# Patient Record
Sex: Female | Born: 2000 | Race: Black or African American | Hispanic: No | Marital: Single | Smoking: Never smoker
Health system: Southern US, Community
[De-identification: ages and names within clinical notes are randomized; demographics above are authoritative.]

---

## 2018-09-03 ENCOUNTER — Other Ambulatory Visit: Payer: Self-pay

## 2018-09-03 ENCOUNTER — Emergency Department (HOSPITAL_COMMUNITY)
Admission: EM | Admit: 2018-09-03 | Discharge: 2018-09-03 | Disposition: A | Discharge status: Home / Self Care | Payer: 59 | Attending: Emergency Medicine | Admitting: Emergency Medicine

## 2018-09-03 ENCOUNTER — Encounter (HOSPITAL_COMMUNITY): Payer: Self-pay | Admitting: Emergency Medicine

## 2018-09-03 DIAGNOSIS — R112 Nausea with vomiting, unspecified: Secondary | ICD-10-CM | POA: Insufficient documentation

## 2018-09-03 LAB — COMPREHENSIVE METABOLIC PANEL
ALBUMIN: 4.5 g/dL (ref 3.5–5.0)
ALT: 17 U/L (ref 0–44)
AST: 22 U/L (ref 15–41)
Alkaline Phosphatase: 60 U/L (ref 38–126)
Anion gap: 10 (ref 5–15)
BUN: 9 mg/dL (ref 6–20)
CO2: 24 mmol/L (ref 22–32)
Calcium: 9.5 mg/dL (ref 8.9–10.3)
Chloride: 104 mmol/L (ref 98–111)
Creatinine, Ser: 0.83 mg/dL (ref 0.44–1.00)
GFR calc Af Amer: >60 mL/min (ref >60)
GFR calc non Af Amer: >60 mL/min (ref >60)
GLUCOSE: 89 mg/dL (ref 70–99)
Potassium: 3.7 mmol/L (ref 3.5–5.1)
SODIUM: 138 mmol/L (ref 135–145)
Total Bilirubin: 0.7 mg/dL (ref 0.3–1.2)
Total Protein: 8.3 g/dL — ABNORMAL HIGH (ref 6.5–8.1)

## 2018-09-03 LAB — URINALYSIS, ROUTINE W REFLEX MICROSCOPIC
Bilirubin Urine: NEGATIVE
Glucose, UA: NEGATIVE mg/dL
Hgb urine dipstick: NEGATIVE
Ketones, ur: 5 mg/dL — AB
Nitrite: NEGATIVE
Protein, ur: NEGATIVE mg/dL
Specific Gravity, Urine: 1.016 (ref 1.005–1.030)
pH: 6 (ref 5.0–8.0)

## 2018-09-03 LAB — I-STAT BETA HCG BLOOD, ED (MC, WL, AP ONLY): I-stat hCG, quantitative: <5 m[IU]/mL (ref <5)

## 2018-09-03 LAB — CBC
HCT: 38.9 % (ref 36.0–46.0)
Hemoglobin: 12.4 g/dL (ref 12.0–15.0)
MCH: 29.2 pg (ref 26.0–34.0)
MCHC: 31.9 g/dL (ref 30.0–36.0)
MCV: 91.5 fL (ref 80.0–100.0)
PLATELETS: 235 10*3/uL (ref 150–400)
RBC: 4.25 MIL/uL (ref 3.87–5.11)
RDW: 12.9 % (ref 11.5–15.5)
WBC: 10.3 10*3/uL (ref 4.0–10.5)
nRBC: 0 % (ref 0.0–0.2)

## 2018-09-03 LAB — LIPASE, BLOOD: Lipase: 31 U/L (ref 11–51)

## 2018-09-03 MED ORDER — SODIUM CHLORIDE 0.9% FLUSH: 3.0000 mL | Freq: Once | INTRAVENOUS | Status: DC

## 2018-09-03 MED ORDER — ONDANSETRON 4 MG PO TBDP
4.0000 mg | ORAL_TABLET | Freq: Once | ORAL | Status: AC
  Administered 2018-09-03: 4 mg via ORAL
  Filled 2018-09-03: qty 1

## 2018-09-03 MED ORDER — ONDANSETRON 4 MG PO TBDP: 4.0000 mg | ORAL_TABLET | Freq: Three times a day (TID) | ORAL | 0 refills | Status: DC | PRN

## 2018-09-03 NOTE — ED Triage Notes (Signed)
Per EMS, patient from home, c/o N/V since 1500 after drinking smoothie. Ambulatory.

## 2018-09-03 NOTE — Discharge Instructions (Signed)
Follow up with your primary care doctor to discuss your hospital visit. Continue to hydrate orally with small sips of fluids throughout the day. Use Zofran as directed for nausea & vomiting.  ° °The 'BRAT' diet is suggested, then progress to diet as tolerated as symptoms abate.  °Bananas.  °Rice.  °Applesauce.  °Toast (and other simple starches such as crackers, potatoes, noodles).  ° °SEEK IMMEDIATE MEDICAL ATTENTION IF: °You begin having localized abdominal pain that does not go away or becomes severe °You develop a fever °Repeated vomiting occurs (multiple uncontrollable episodes) or you are unable to keep fluids down °Blood is being passed in stools or vomit (bright red or black tarry stools).  °New or worsening symptoms develop, any additional concerns.  °

## 2018-09-03 NOTE — ED Provider Notes (Signed)
[facility]-EMERGENCY DEPT Provider Note   CSN: [id number] Arrival date & time: 09/03/18  1814    History   Chief Complaint Chief Complaint  Patient presents with   Emesis    HPI Kaitlyn Deleon is a 18 y.o. female.     The history is provided by the patient and medical records. No language interpreter was used.  Emesis  Associated symptoms: abdominal pain   Associated symptoms: no diarrhea    Kaitlyn Deleon is a 18 y.o. female with no known past medical history who presents to the Emergency Department complaining of nausea and vomiting that began about 3:00 this afternoon.  Patient states that she drank a smoothie and within a few minutes after this, she threw up.  She did have generalized abdominal pain at onset which has been improving.  She has not been able to keep any fluids down since the onset of her symptoms.  She denies any known sick contacts.  No travel.  No fever.  No diarrhea, constipation or blood in the stool.  No chest pain or trouble breathing.  History reviewed. No pertinent past medical history.  There are no active problems to display for this patient.   History reviewed. No pertinent surgical history.   OB History   No obstetric history on file.      Home Medications    Prior to Admission medications   Medication Sig Start Date End Date Taking? Authorizing Provider  ondansetron (ZOFRAN ODT) 4 MG disintegrating tablet Take 1 tablet (4 mg total) by mouth every 8 (eight) hours as needed for nausea or vomiting. 09/03/18   Gearldene Fiorenza, Chase Picket, PA-C    Family History No family history on file.  Social History Social History   Tobacco Use   Smoking status: Not on file  Substance Use Topics   Alcohol use: Not on file   Drug use: Not on file     Allergies   Patient has no known allergies.   Review of Systems Review of Systems  Gastrointestinal: Positive for abdominal pain, nausea and vomiting. Negative for blood in  stool, constipation and diarrhea.  All other systems reviewed and are negative.    Physical Exam Updated Vital Signs BP 109/64    Pulse 62    Temp 98.6 F (37 C) (Oral)    Resp 16    LMP 08/05/2018 (Approximate)    SpO2 100%   Physical Exam Vitals signs and nursing note reviewed.  Constitutional:      General: She is not in acute distress.    Appearance: She is well-developed.     Comments: Well-appearing.  HENT:     Head: Normocephalic and atraumatic.  Neck:     Musculoskeletal: Neck supple.  Cardiovascular:     Heart sounds: Normal heart sounds. No murmur.     Comments: Regular rate and rhythm on exam. Pulmonary:     Effort: Pulmonary effort is normal. No respiratory distress.     Breath sounds: Normal breath sounds.  Abdominal:     General: There is no distension.     Palpations: Abdomen is soft.     Comments: Mild generalized abdominal tenderness without rebound or guarding.  No focal tenderness at McBurney's.  Negative Murphy's.  Skin:    General: Skin is warm and dry.  Neurological:     Mental Status: She is alert and oriented to person, place, and time.      ED Treatments / Results  Labs (all labs ordered  are listed, but only abnormal results are displayed) Labs Reviewed  COMPREHENSIVE METABOLIC PANEL - Abnormal; Notable for the following components:      Result Value   Total Protein 8.3 (*)    All other components within normal limits  URINALYSIS, ROUTINE W REFLEX MICROSCOPIC - Abnormal; Notable for the following components:   APPearance HAZY (*)    Ketones, ur 5 (*)    Leukocytes,Ua SMALL (*)    Bacteria, UA RARE (*)    All other components within normal limits  LIPASE, BLOOD  CBC  I-STAT BETA HCG BLOOD, ED ([facility], [facility], [facility] ONLY)    EKG None  Radiology No results found.  Procedures Procedures (including critical care time)  Medications Ordered in ED Medications  sodium chloride flush (NS) 0.9 % injection 3 mL (has no administration in time  range)  ondansetron (ZOFRAN-ODT) disintegrating tablet 4 mg (4 mg Oral Given 09/03/18 1956)     Initial Impression / Assessment and Plan / ED Course  I have reviewed the triage vital signs and the nursing notes.  Pertinent labs & imaging results that were available during my care of the patient were reviewed by me and considered in my medical decision making (see chart for details).       Gianna Deleon is a 18 y.o. female who presents to ED for nausea, vomiting which began just prior to arrival. On exam, patient is afebrile, non-toxic appearing with a non-surgical abdominal exam.  She states that she threw up after having a smoothie.  She then had 1 more episode of emesis.  Her abdominal pain has been gradually improving since the incident.  She actually feels much better than symptom onset.  She had 4 mg of ODT Zofran.  On reevaluation, she reports that she now feels fine.  Repeat abdominal exam completely benign.  She is tolerating PO without any difficulty. Evaluation does not show pathology that would require ongoing emergent intervention or inpatient treatment. Rx for Zofran given. PCP follow up encouraged. Spoke at length with patient about signs or symptoms that should prompt return to emergency Department including inability to tolerate PO, blood in the stools, fevers, focal localization of abdominal pain, new/worsening symptoms or any additional concerns. Patient understands diagnosis and plan of care as dictated above. All questions answered.   Final Clinical Impressions(s) / ED Diagnoses   Final diagnoses:  Non-intractable vomiting with nausea, unspecified vomiting type    ED Discharge Orders         Ordered    ondansetron (ZOFRAN ODT) 4 MG disintegrating tablet  Every 8 hours PRN     09/03/18 2038           Ronica Vivian, Chase Picket, PA-C 09/03/18 2155    Melene Plan, DO 09/03/18 2218

## 2018-10-31 ENCOUNTER — Encounter (HOSPITAL_COMMUNITY): Payer: Self-pay | Admitting: Emergency Medicine

## 2018-10-31 ENCOUNTER — Emergency Department (HOSPITAL_COMMUNITY)
Admission: EM | Admit: 2018-10-31 | Discharge: 2018-10-31 | Disposition: A | Discharge status: Home / Self Care | Payer: 59 | Attending: Emergency Medicine | Admitting: Emergency Medicine

## 2018-10-31 ENCOUNTER — Other Ambulatory Visit: Payer: Self-pay

## 2018-10-31 DIAGNOSIS — E876 Hypokalemia: Secondary | ICD-10-CM

## 2018-10-31 DIAGNOSIS — O219 Vomiting of pregnancy, unspecified: Secondary | ICD-10-CM | POA: Diagnosis not present

## 2018-10-31 DIAGNOSIS — Z3A01 Less than 8 weeks gestation of pregnancy: Secondary | ICD-10-CM | POA: Insufficient documentation

## 2018-10-31 DIAGNOSIS — O99281 Endocrine, nutritional and metabolic diseases complicating pregnancy, first trimester: Secondary | ICD-10-CM | POA: Insufficient documentation

## 2018-10-31 LAB — URINALYSIS, ROUTINE W REFLEX MICROSCOPIC
Bilirubin Urine: NEGATIVE
Glucose, UA: NEGATIVE mg/dL
Hgb urine dipstick: NEGATIVE
Ketones, ur: 80 mg/dL — AB
Nitrite: NEGATIVE
Protein, ur: 100 mg/dL — AB
Specific Gravity, Urine: 1.031 — ABNORMAL HIGH (ref 1.005–1.030)
pH: 6 (ref 5.0–8.0)

## 2018-10-31 LAB — BASIC METABOLIC PANEL
Anion gap: 18 — ABNORMAL HIGH (ref 5–15)
BUN: 11 mg/dL (ref 6–20)
CO2: 18 mmol/L — ABNORMAL LOW (ref 22–32)
Calcium: 9.8 mg/dL (ref 8.9–10.3)
Chloride: 99 mmol/L (ref 98–111)
Creatinine, Ser: 0.84 mg/dL (ref 0.44–1.00)
GFR calc Af Amer: >60 mL/min (ref >60)
GFR calc non Af Amer: >60 mL/min (ref >60)
Glucose, Bld: 94 mg/dL (ref 70–99)
Potassium: 2.9 mmol/L — ABNORMAL LOW (ref 3.5–5.1)
Sodium: 135 mmol/L (ref 135–145)

## 2018-10-31 LAB — CBC WITH DIFFERENTIAL/PLATELET
Abs Immature Granulocytes: 0.06 10*3/uL (ref 0.00–0.07)
Basophils Absolute: 0.1 10*3/uL (ref 0.0–0.1)
Basophils Relative: 0 %
Eosinophils Absolute: 0 10*3/uL (ref 0.0–0.5)
Eosinophils Relative: 0 %
HCT: 40.9 % (ref 36.0–46.0)
Hemoglobin: 13.8 g/dL (ref 12.0–15.0)
Immature Granulocytes: 1 %
Lymphocytes Relative: 10 %
Lymphs Abs: 1.3 10*3/uL (ref 0.7–4.0)
MCH: 28.9 pg (ref 26.0–34.0)
MCHC: 33.7 g/dL (ref 30.0–36.0)
MCV: 85.6 fL (ref 80.0–100.0)
Monocytes Absolute: 0.5 10*3/uL (ref 0.1–1.0)
Monocytes Relative: 4 %
Neutro Abs: 11.3 10*3/uL — ABNORMAL HIGH (ref 1.7–7.7)
Neutrophils Relative %: 85 %
Platelets: 292 10*3/uL (ref 150–400)
RBC: 4.78 MIL/uL (ref 3.87–5.11)
RDW: 12.2 % (ref 11.5–15.5)
WBC: 13.2 10*3/uL — ABNORMAL HIGH (ref 4.0–10.5)
nRBC: 0 % (ref 0.0–0.2)

## 2018-10-31 LAB — I-STAT BETA HCG BLOOD, ED (MC, WL, AP ONLY): I-stat hCG, quantitative: >2000 m[IU]/mL — ABNORMAL HIGH (ref <5)

## 2018-10-31 MED ORDER — SODIUM CHLORIDE 0.9 % IV BOLUS
1000.0000 mL | Freq: Once | INTRAVENOUS | Status: AC
  Administered 2018-10-31: 1000 mL via INTRAVENOUS

## 2018-10-31 MED ORDER — METOCLOPRAMIDE HCL 5 MG/ML IJ SOLN
10.0000 mg | Freq: Once | INTRAMUSCULAR | Status: AC
  Administered 2018-10-31: 01:00:00 10 mg via INTRAVENOUS
  Filled 2018-10-31: qty 2

## 2018-10-31 MED ORDER — POTASSIUM CHLORIDE CRYS ER 20 MEQ PO TBCR
40.0000 meq | EXTENDED_RELEASE_TABLET | Freq: Once | ORAL | Status: AC
  Administered 2018-10-31: 40 meq via ORAL
  Filled 2018-10-31: qty 2

## 2018-10-31 MED ORDER — METOCLOPRAMIDE HCL 10 MG PO TABS: 10.0000 mg | ORAL_TABLET | Freq: Four times a day (QID) | ORAL | 0 refills | Status: AC

## 2018-10-31 NOTE — ED Triage Notes (Signed)
Pt states she is 7 weeks preg and having issues with N/V and is unable to keep food or drink down for the last  Week. Pt report having generalized weakness over last several days.

## 2018-10-31 NOTE — ED Provider Notes (Signed)
[facility]-EMERGENCY DEPT Provider Note   CSN: [id number] Arrival date & time: 10/31/18  0014    History   Chief Complaint No chief complaint on file.   HPI Kaitlyn Deleon is a 18 y.o. female.     Patient with no previous abdominal surgeries, G1P0 who is currently approximately [redacted] weeks pregnant presents with complaint of nausea and vomiting over the past week.  Patient states that she has not been able to keep down solid foods and only some liquids.  She denies any abdominal pain.  No vaginal bleeding.  She reports a mild white discharge.  No urinary symptoms.  She has been trying to eat bland foods, fruit, ginger ale at home without any improvement.  No medications.  She states that she had a recent ultrasound due to vomiting at the [facility] clinic.  She states that this is how she knows she is about [redacted] weeks pregnant.     History reviewed. No pertinent past medical history.  There are no active problems to display for this patient.   History reviewed. No pertinent surgical history.   OB History   No obstetric history on file.      Home Medications    Prior to Admission medications   Medication Sig Start Date End Date Taking? Authorizing Provider  ondansetron (ZOFRAN ODT) 4 MG disintegrating tablet Take 1 tablet (4 mg total) by mouth every 8 (eight) hours as needed for nausea or vomiting. 09/03/18   Ward, Chase Picket, PA-C    Family History History reviewed. No pertinent family history.  Social History Social History   Tobacco Use  . Smoking status: Never Smoker  . Smokeless tobacco: Never Used  Substance Use Topics  . Alcohol use: Yes  . Drug use: Never     Allergies   Patient has no known allergies.   Review of Systems Review of Systems  Constitutional: Negative for fever.  HENT: Negative for rhinorrhea and sore throat.   Eyes: Negative for redness.  Respiratory: Negative for cough and shortness of breath.    Cardiovascular: Negative for chest pain.  Gastrointestinal: Positive for nausea and vomiting. Negative for abdominal pain and diarrhea.  Genitourinary: Positive for vaginal discharge. Negative for dysuria and vaginal bleeding.  Musculoskeletal: Negative for myalgias.  Skin: Negative for rash.  Neurological: Negative for headaches.     Physical Exam Updated Vital Signs BP (!) 105/94 (BP Location: Left Arm)   Pulse (!) 102   Temp 98.5 F (36.9 C) (Oral)   Resp 18   Ht 5\' 7"  (1.702 m)   Wt 63.5 kg   SpO2 100%   BMI 21.93 kg/m   Physical Exam Vitals signs and nursing note reviewed.  Constitutional:      Appearance: She is well-developed.  HENT:     Head: Normocephalic and atraumatic.  Eyes:     Conjunctiva/sclera: Conjunctivae normal.  Neck:     Musculoskeletal: Normal range of motion and neck supple.  Pulmonary:     Effort: No respiratory distress.  Abdominal:     Tenderness: There is no abdominal tenderness. There is no guarding or rebound.  Skin:    General: Skin is warm and dry.  Neurological:     Mental Status: She is alert.      ED Treatments / Results  Labs (all labs ordered are listed, but only abnormal results are displayed) Labs Reviewed  CBC WITH DIFFERENTIAL/PLATELET  BASIC METABOLIC PANEL  URINALYSIS, ROUTINE W REFLEX MICROSCOPIC  I-STAT  BETA HCG BLOOD, ED ([facility], [facility], [facility] ONLY)    EKG None  Radiology No results found.  Procedures Procedures (including critical care time)  Medications Ordered in ED Medications  sodium chloride 0.9 % bolus 1,000 mL (has no administration in time range)     Initial Impression / Assessment and Plan / ED Course  I have reviewed the triage vital signs and the nursing notes.  Pertinent labs & imaging results that were available during my care of the patient were reviewed by me and considered in my medical decision making (see chart for details).        Patient seen and examined. Work-up initiated.  Medications ordered. She looks well.   Vital signs reviewed and are as follows: BP (!) 105/94 (BP Location: Left Arm)   Pulse (!) 102   Temp 98.5 F (36.9 C) (Oral)   Resp 18   Ht 5\' 7"  (1.702 m)   Wt 63.5 kg   SpO2 100%   BMI 21.93 kg/m   3:52 AM patient has responded well to fluids and antiemetics while in the emergency department tonight.  She is now tolerating oral fluids and eating applesauce in the room.  Lab work significant for hypokalemia.  She has tolerated oral repletion.  Patient has not yet started taking prenatal vitamins and I encouraged her to do so.  Discharged home with Reglan for vomiting.  We also discussed use of doxylamine and vitamin B6.  Patient given instructions on how to use these.  Discussed clear liquid diet and bland foods over the next 24 hours.  Encouraged return if worsening or changing.  Otherwise, prenatal follow-up as planned.  Final Clinical Impressions(s) / ED Diagnoses   Final diagnoses:  Excessive vomiting in pregnancy  Hypokalemia   Patient with vomiting in first trimester pregnancy.  She has some mild dehydration tonight.  She has responded well to treatment in the emergency department.  Abdomen is soft and nontender.  She does not have any lower abdominal pain, cramping, back pain, vaginal bleeding to suggest threatened pregnancy.  She reportedly had a recent ultrasound to confirm intrauterine pregnancy.  Symptoms are controlled in the emergency department and patient is ready for discharge.  ED Discharge Orders         Ordered    metoCLOPramide (REGLAN) 10 MG tablet  Every 6 hours     10/31/18 0350           Renne CriglerGeiple, Paarth Cropper, PA-C 10/31/18 0354    Geoffery Lyonselo, Douglas, MD 10/31/18 [phone]

## 2018-10-31 NOTE — Discharge Instructions (Signed)
Please read and follow all provided instructions.  Your diagnoses today include:  1. Excessive vomiting in pregnancy   2. Hypokalemia     Tests performed today include:  Blood counts and electrolytes - shows low potassium  Blood tests to check kidney function  Urine test to look for infection - shows dehydration, no infection  Vital signs. See below for your results today.   Medications prescribed:   Reglan - medication for nausea and vomiting  You may use doxylamine, available as an over-the-counter sleeping pills (eg, Unisom Sleep Tabs): One-half of the 25 mg over-the-counter tablet twice a day for vomiting. In addition, pyridoxine 25 mg (Vitamin B6), also available over-the-counter, is taken three or four times per day for vomiting.   Take any prescribed medications only as directed.  Home care instructions:   Follow any educational materials contained in this packet.   Keep drinking plenty of fluids and use the medicine for nausea as directed.    Drink clear liquids for the next 24 hours and introduce solid foods slowly after 24 hours using the b.r.a.t. diet (Bananas, Rice, Applesauce, Toast, Yogurt).    Follow-up instructions: Please follow-up with your primary care provider in the next 2 days for further evaluation of your symptoms.   Return instructions:  SEEK IMMEDIATE MEDICAL ATTENTION IF:  If you have pain that does not go away or becomes severe   A temperature above 101F develops   Repeated vomiting occurs (multiple episodes)   If you have pain that becomes localized to portions of the abdomen. The right side could possibly be appendicitis. In an adult, the left lower portion of the abdomen could be colitis or diverticulitis.   Blood is being passed in stools or vomit (bright red or black tarry stools)   You develop chest pain, difficulty breathing, dizziness or fainting, or become confused, poorly responsive, or inconsolable (young children)  If you have  any other emergent concerns regarding your health  Additional Information: Abdominal (belly) pain can be caused by many things. Your caregiver performed an examination and possibly ordered blood/urine tests and imaging (CT scan, x-rays, ultrasound). Many cases can be observed and treated at home after initial evaluation in the emergency department. Even though you are being discharged home, abdominal pain can be unpredictable. Therefore, you need a repeated exam if your pain does not resolve, returns, or worsens. Most patients with abdominal pain don't have to be admitted to the hospital or have surgery, but serious problems like appendicitis and gallbladder attacks can start out as nonspecific pain. Many abdominal conditions cannot be diagnosed in one visit, so follow-up evaluations are very important.  Your vital signs today were: BP 101/79    Pulse 95    Temp 98.5 F (36.9 C) (Oral)    Resp 18    Ht 5\' 7"  (1.702 m)    Wt 63.5 kg    SpO2 100%    BMI 21.93 kg/m  If your blood pressure (bp) was elevated above 135/85 this visit, please have this repeated by your doctor within one month. --------------

## 2018-10-31 NOTE — ED Notes (Signed)
Pt given water as well as Sprite and apple sauce for PO challenge, which she tolerated well

## 2018-10-31 NOTE — ED Notes (Signed)
Pt wants more apple sauce.

## 2019-11-13 ENCOUNTER — Encounter (HOSPITAL_COMMUNITY): Payer: Self-pay

## 2019-11-13 ENCOUNTER — Other Ambulatory Visit: Payer: Self-pay

## 2019-11-13 ENCOUNTER — Ambulatory Visit (HOSPITAL_COMMUNITY)
Admission: EM | Admit: 2019-11-13 | Discharge: 2019-11-13 | Disposition: A | Discharge status: Home / Self Care | Payer: 59 | Attending: Family Medicine | Admitting: Family Medicine

## 2019-11-13 DIAGNOSIS — L723 Sebaceous cyst: Secondary | ICD-10-CM | POA: Diagnosis not present

## 2019-11-13 DIAGNOSIS — L089 Local infection of the skin and subcutaneous tissue, unspecified: Secondary | ICD-10-CM | POA: Diagnosis not present

## 2019-11-13 MED ORDER — HYDROCODONE-ACETAMINOPHEN 5-325 MG PO TABS: 1.0000 | ORAL_TABLET | Freq: Four times a day (QID) | ORAL | 0 refills | Status: AC | PRN

## 2019-11-13 MED ORDER — DOXYCYCLINE HYCLATE 100 MG PO CAPS: 100.0000 mg | ORAL_CAPSULE | Freq: Two times a day (BID) | ORAL | 0 refills | Status: AC

## 2019-11-13 NOTE — ED Provider Notes (Signed)
[facility]    CSN: [id number] Arrival date & time: 11/13/19  2202      History   Chief Complaint Chief Complaint  Patient presents with  . Cyst    HPI Kaitlyn Deleon is a 19 y.o. female.   HPI  Patient has a sebaceous cyst on her right anterior chest, breast area for about a month Because it is near her breast she went to her primary care doctor and got a referral for an ultrasound of her breast. It was confirmed to be a sebaceous cyst She states that since her ultrasound she has had increased pain, swelling, and redness of the cyst She is here to have it treated  History reviewed. No pertinent past medical history.  There are no problems to display for this patient.   History reviewed. No pertinent surgical history.  OB History   No obstetric history on file.      Home Medications    Prior to Admission medications   Medication Sig Start Date End Date Taking? Authorizing Provider  doxycycline (VIBRAMYCIN) 100 MG capsule Take 1 capsule (100 mg total) by mouth 2 (two) times daily. 11/13/19   Raylene Everts, MD  HYDROcodone-acetaminophen (NORCO/VICODIN) 5-325 MG tablet Take 1-2 tablets by mouth every 6 (six) hours as needed. 11/13/19   Raylene Everts, MD  metoCLOPramide (REGLAN) 10 MG tablet Take 1 tablet (10 mg total) by mouth every 6 (six) hours. 10/31/18   Carlisle Cater, PA-C  ondansetron (ZOFRAN ODT) 4 MG disintegrating tablet Take 1 tablet (4 mg total) by mouth every 8 (eight) hours as needed for nausea or vomiting. 09/03/18   Ward, Ozella Almond, PA-C    Family History History reviewed. No pertinent family history.  Social History Social History   Tobacco Use  . Smoking status: Never Smoker  . Smokeless tobacco: Never Used  Substance Use Topics  . Alcohol use: Yes  . Drug use: Never     Allergies   Patient has no known allergies.   Review of Systems Review of Systems  Skin: Positive for wound.     Physical Exam Triage  Vital Signs ED Triage Vitals  Enc Vitals Group     BP 11/13/19 1900 (!) 101/56     Pulse Rate 11/13/19 1900 93     Resp 11/13/19 1900 17     Temp 11/13/19 1900 98.5 F (36.9 C)     Temp src --      SpO2 11/13/19 1900 100 %     Weight --      Height --      Head Circumference --      Peak Flow --      Pain Score 11/13/19 1858 10     Pain Loc --      Pain Edu? --      Excl. in [place]? --    No data found.  Updated Vital Signs BP (!) 101/56 (BP Location: Right Arm)   Pulse 93   Temp 98.5 F (36.9 C)   Resp 17   LMP 10/26/2019 (Exact Date)   SpO2 100%       Physical Exam Constitutional:      General: She is not in acute distress.    Appearance: She is well-developed.  HENT:     Head: Normocephalic and atraumatic.  Eyes:     Conjunctiva/sclera: Conjunctivae normal.     Pupils: Pupils are equal, round, and reactive to light.  Cardiovascular:  Rate and Rhythm: Normal rate.  Pulmonary:     Effort: Pulmonary effort is normal. No respiratory distress.  Chest:       Comments: 3 cm cyst with faint erythema surrounding, very tender Musculoskeletal:        General: Normal range of motion.     Cervical back: Normal range of motion.  Skin:    General: Skin is warm and dry.  Neurological:     Mental Status: She is alert.      UC Treatments / Results  Labs (all labs ordered are listed, but only abnormal results are displayed) Labs Reviewed - No data to display  EKG   Radiology No results found.  Procedures Incision and Drainage  Date/Time: 11/13/2019 9:50 PM Performed by: Raylene Everts, MD Authorized by: Raylene Everts, MD   Consent:    Consent obtained:  Verbal   Consent given by:  Patient   Risks discussed:  Incomplete drainage   Alternatives discussed:  No treatment and alternative treatment Location:    Type:  Cyst   Size:  3 cm   Location:  Trunk   Trunk location:  R breast Pre-procedure details:    Skin preparation:   Betadine Anesthesia (see MAR for exact dosages):    Anesthesia method:  Local infiltration   Local anesthetic:  Lidocaine 2% w/o epi Procedure type:    Complexity:  Simple Procedure details:    Needle aspiration: no     Incision types:  Stab incision   Scalpel blade:  11   Wound management:  Probed and deloculated   Drainage:  Purulent   Drainage amount:  Copious   Wound treatment:  Wound left open Post-procedure details:    Patient tolerance of procedure:  Procedure terminated at patient's request Comments:     InitialPatient did not tolerate the initial small lidocaine wheal, given with a 27-gauge needle.  Cried excessively and loudly.  I sprayed the area with ethyl chloride and was able to inject a little bit more lidocaine.  After 10 minutes waiting I ascertained that it was numb and made a stab incision.  This was met with additional crying and delay.  She did not tolerate pressing on the wound to express the sebum.  He was mostly full of very thick malodorous sebum which was hard to express.  At her request I stopped after evacuating three quarters of the wound and de loculating it.  She is advised that there still is some sebum that will need to be expressed.  Alternately she can let it heal and have the cyst removed in the future electively   (including critical care time)  Medications Ordered in UC Medications - No data to display  Initial Impression / Assessment and Plan / UC Course  I have reviewed the triage vital signs and the nursing notes.  Pertinent labs & imaging results that were available during my care of the patient were reviewed by me and considered in my medical decision making (see chart for details).     Wound care discussed Final Clinical Impressions(s) / UC Diagnoses   Final diagnoses:  Infected sebaceous cyst of skin     Discharge Instructions     Take the antibiotic 2 x a day Take the pain medicine as needed This will drain a couple of days It  is OK for your to gently express more fluid from the cyst See a dermatologist in follow up if you desire removal   ED Prescriptions  Medication Sig Dispense Auth. Provider   HYDROcodone-acetaminophen (NORCO/VICODIN) 5-325 MG tablet Take 1-2 tablets by mouth every 6 (six) hours as needed. 12 tablet Raylene Everts, MD   doxycycline (VIBRAMYCIN) 100 MG capsule Take 1 capsule (100 mg total) by mouth 2 (two) times daily. 14 capsule Raylene Everts, MD     I have reviewed the PDMP during this encounter.   Raylene Everts, MD 11/13/19 2154

## 2019-11-13 NOTE — Discharge Instructions (Signed)
Take the antibiotic 2 x a day Take the pain medicine as needed This will drain a couple of days It is OK for your to gently express more fluid from the cyst See a dermatologist in follow up if you desire removal

## 2019-11-13 NOTE — ED Triage Notes (Signed)
Pt reports having a cyst in the right breast x 1 year. Right is tender and sore since Tuesday.

## 2020-10-01 ENCOUNTER — Emergency Department (HOSPITAL_COMMUNITY)
Admission: EM | Admit: 2020-10-01 | Discharge: 2020-10-01 | Disposition: A | Discharge status: Home / Self Care | Payer: 59 | Attending: Emergency Medicine | Admitting: Emergency Medicine

## 2020-10-01 ENCOUNTER — Emergency Department (HOSPITAL_COMMUNITY): Payer: 59

## 2020-10-01 ENCOUNTER — Encounter (HOSPITAL_COMMUNITY): Payer: Self-pay

## 2020-10-01 ENCOUNTER — Other Ambulatory Visit: Payer: Self-pay

## 2020-10-01 DIAGNOSIS — R112 Nausea with vomiting, unspecified: Secondary | ICD-10-CM | POA: Insufficient documentation

## 2020-10-01 DIAGNOSIS — D72829 Elevated white blood cell count, unspecified: Secondary | ICD-10-CM | POA: Diagnosis not present

## 2020-10-01 DIAGNOSIS — J111 Influenza due to unidentified influenza virus with other respiratory manifestations: Secondary | ICD-10-CM | POA: Insufficient documentation

## 2020-10-01 DIAGNOSIS — R0602 Shortness of breath: Secondary | ICD-10-CM

## 2020-10-01 DIAGNOSIS — R000 Tachycardia, unspecified: Secondary | ICD-10-CM | POA: Diagnosis not present

## 2020-10-01 DIAGNOSIS — R718 Other abnormality of red blood cells: Secondary | ICD-10-CM | POA: Diagnosis not present

## 2020-10-01 DIAGNOSIS — R7401 Elevation of levels of liver transaminase levels: Secondary | ICD-10-CM | POA: Insufficient documentation

## 2020-10-01 DIAGNOSIS — R809 Proteinuria, unspecified: Secondary | ICD-10-CM | POA: Diagnosis not present

## 2020-10-01 LAB — COMPREHENSIVE METABOLIC PANEL
ALT: 35 U/L (ref 0–44)
AST: 48 U/L — ABNORMAL HIGH (ref 15–41)
Albumin: 4.4 g/dL (ref 3.5–5.0)
Alkaline Phosphatase: 71 U/L (ref 38–126)
Anion gap: 14 (ref 5–15)
BUN: 8 mg/dL (ref 6–20)
CO2: 22 mmol/L (ref 22–32)
Calcium: 9.1 mg/dL (ref 8.9–10.3)
Chloride: 103 mmol/L (ref 98–111)
Creatinine, Ser: 0.78 mg/dL (ref 0.44–1.00)
GFR, Estimated: >60 mL/min (ref >60)
Glucose, Bld: 134 mg/dL — ABNORMAL HIGH (ref 70–99)
Potassium: 3.1 mmol/L — ABNORMAL LOW (ref 3.5–5.1)
Sodium: 139 mmol/L (ref 135–145)
Total Bilirubin: 0.5 mg/dL (ref 0.3–1.2)
Total Protein: 8.3 g/dL — ABNORMAL HIGH (ref 6.5–8.1)

## 2020-10-01 LAB — URINALYSIS, ROUTINE W REFLEX MICROSCOPIC
Bilirubin Urine: NEGATIVE
Glucose, UA: NEGATIVE mg/dL
Hgb urine dipstick: NEGATIVE
Ketones, ur: 20 mg/dL — AB
Leukocytes,Ua: NEGATIVE
Nitrite: NEGATIVE
Protein, ur: >=300 mg/dL — AB
Specific Gravity, Urine: 1.037 — ABNORMAL HIGH (ref 1.005–1.030)
pH: 5 (ref 5.0–8.0)

## 2020-10-01 LAB — PREGNANCY, URINE: Preg Test, Ur: NEGATIVE

## 2020-10-01 MED ORDER — POTASSIUM CHLORIDE CRYS ER 20 MEQ PO TBCR
20.0000 meq | EXTENDED_RELEASE_TABLET | Freq: Once | ORAL | Status: AC
  Administered 2020-10-01: 20 meq via ORAL
  Filled 2020-10-01: qty 1

## 2020-10-01 MED ORDER — SODIUM CHLORIDE 0.9 % IV BOLUS
1000.0000 mL | Freq: Once | INTRAVENOUS | Status: AC
  Administered 2020-10-01: 1000 mL via INTRAVENOUS

## 2020-10-01 MED ORDER — ONDANSETRON 4 MG PO TBDP: 4.0000 mg | ORAL_TABLET | Freq: Three times a day (TID) | ORAL | 0 refills | Status: AC | PRN

## 2020-10-01 MED ORDER — ONDANSETRON HCL 4 MG/2ML IJ SOLN
4.0000 mg | Freq: Once | INTRAMUSCULAR | Status: AC
  Administered 2020-10-01: 4 mg via INTRAVENOUS
  Filled 2020-10-01: qty 2

## 2020-10-01 NOTE — Discharge Instructions (Addendum)
You were seen in the emergency department for dehydration nausea and vomiting in the setting of recent diagnosis of flu.  Your lab work showed your potassium to be mildly low.  Your chest x-ray did not show any pneumonia.  We are prescribing you some nausea medication.  Please continue Tylenol and ibuprofen.  Push fluids.  Return to the emergency department for any worsening or concerning symptoms

## 2020-10-01 NOTE — ED Triage Notes (Signed)
Patient arrived stating she has been flu + over the last two days. Reporting NV. Last dose of Ibuprofen yesterday.

## 2020-10-01 NOTE — ED Triage Notes (Signed)
Emergency Medicine Provider Triage Evaluation Note  [facility] , a 20 y.o. female  was evaluated in triage.  Pt complains of nausea and vomiting.  Patient reports that she tested positive for the flu on Tuesday.  Since then she has been having nausea, vomiting, body aches, fever, shortness of breath patient reports that she took antinausea medication this morning with minimal relief of her symptoms.  Patient reports vomiting 6 times in the last 24 hours.  Patient denies any bloody emesis or coffee-ground.  Patient is requesting IV hydration.  Review of Systems  Positive: Myalgias, nausea, vomiting, shortness of breath Negative: Abdominal pain, denied emesis, coffee-ground emesis,  Physical Exam  BP 118/85 (BP Location: Right Arm)   Pulse (!) 101   Temp 98.4 F (36.9 C) (Oral)   Resp 18   Ht 5\' 7"  (1.702 m)   Wt 63.5 kg   LMP 09/17/2020   SpO2 99%   BMI 21.93 kg/m  Gen:   Awake, no distress   HEENT:  Atraumatic  Resp:  Normal effort, clear to auscultation bilaterally Cardiac:  Normal rate  Abd:   Nondistended, nontender MSK:   Moves extremities without difficulty  Neuro:  Speech clear   Medical Decision Making  Medically screening exam initiated at 8:38 PM.  Appropriate orders placed.  [facility] was informed that the remainder of the evaluation will be completed by another provider, this initial triage assessment does not replace that evaluation, and the importance of remaining in the ED until their evaluation is complete.  Clinical Impression   The patient appears stable so that the remainder of the work up may be completed by another provider.      [facility], Haskel Schroeder 10/01/20 2039

## 2020-10-01 NOTE — ED Provider Notes (Signed)
[facility]   CSN: [id number] Arrival date & time: 10/01/20  2010     History Chief Complaint  Patient presents with  . Emesis    Kaitlyn Deleon is a 20 y.o. female.  She has no significant past medical history.  She started feeling sick 5 days ago with body aches shortness of breath nausea vomiting.  [facility] for flu at [facility] [employer].  She has been taking ibuprofen and some over-the-counter nausea medication.  She is complaining of worsening nausea and vomiting and feeling generally weak.  The history is provided by the patient.  Influenza Presenting symptoms: cough, fatigue, fever, headache, myalgias, nausea, shortness of breath and vomiting   Presenting symptoms: no sore throat   Severity:  Moderate Onset quality:  Gradual Progression:  Unchanged Chronicity:  New Relieved by:  None tried Worsened by:  Nothing Ineffective treatments:  OTC medications Associated symptoms: no mental status change and no syncope   Risk factors: not pregnant        History reviewed. No pertinent past medical history.  There are no problems to display for this patient.   History reviewed. No pertinent surgical history.   OB History   No obstetric history on file.     No family history on file.  Social History   Tobacco Use  . Smoking status: Never Smoker  . Smokeless tobacco: Never Used  Substance Use Topics  . Alcohol use: Yes  . Drug use: Never    Home Medications Prior to Admission medications   Medication Sig Start Date End Date Taking? Authorizing Provider  doxycycline (VIBRAMYCIN) 100 MG capsule Take 1 capsule (100 mg total) by mouth 2 (two) times daily. 11/13/19   Eustace Moore, MD  HYDROcodone-acetaminophen (NORCO/VICODIN) 5-325 MG tablet Take 1-2 tablets by mouth every 6 (six) hours as needed. 11/13/19   Eustace Moore, MD  metoCLOPramide (REGLAN) 10 MG tablet Take 1 tablet (10 mg  total) by mouth every 6 (six) hours. 10/31/18   Renne Crigler, PA-C  ondansetron (ZOFRAN ODT) 4 MG disintegrating tablet Take 1 tablet (4 mg total) by mouth every 8 (eight) hours as needed for nausea or vomiting. 09/03/18   Ward, Chase Picket, PA-C    Allergies    Patient has no known allergies.  Review of Systems   Review of Systems  Constitutional: Positive for fatigue and fever.  HENT: Negative for sore throat.   Eyes: Negative for visual disturbance.  Respiratory: Positive for cough and shortness of breath.   Cardiovascular: Negative for chest pain.  Gastrointestinal: Positive for nausea and vomiting. Negative for abdominal pain.  Genitourinary: Negative for dysuria.  Musculoskeletal: Positive for back pain and myalgias.  Skin: Negative for rash.  Neurological: Positive for headaches.    Physical Exam Updated Vital Signs BP 118/85 (BP Location: Right Arm)   Pulse (!) 101   Temp 98.4 F (36.9 C) (Oral)   Resp 18   Ht 5\' 7"  (1.702 m)   Wt 63.5 kg   LMP 09/17/2020   SpO2 99%   BMI 21.93 kg/m   Physical Exam Vitals and nursing note reviewed.  Constitutional:      General: She is not in acute distress.    Appearance: Normal appearance. She is well-developed.  HENT:     Head: Normocephalic and atraumatic.  Eyes:     Conjunctiva/sclera: Conjunctivae normal.  Cardiovascular:     Rate and Rhythm: Regular rhythm. Tachycardia present.  Pulses: Normal pulses.     Heart sounds: No murmur heard.   Pulmonary:     Effort: Pulmonary effort is normal. No respiratory distress.     Breath sounds: Normal breath sounds.  Abdominal:     Palpations: Abdomen is soft.     Tenderness: There is no abdominal tenderness. There is no guarding or rebound.  Musculoskeletal:        General: No deformity or signs of injury. Normal range of motion.     Cervical back: Neck supple.  Skin:    General: Skin is warm and dry.     Capillary Refill: Capillary refill takes less than 2 seconds.   Neurological:     General: No focal deficit present.     Mental Status: She is alert.     ED Results / Procedures / Treatments   Labs (all labs ordered are listed, but only abnormal results are displayed) Labs Reviewed  URINALYSIS, ROUTINE W REFLEX MICROSCOPIC - Abnormal; Notable for the following components:      Result Value   Color, Urine AMBER (*)    APPearance HAZY (*)    Specific Gravity, Urine 1.037 (*)    Ketones, ur 20 (*)    Protein, ur >=300 (*)    Bacteria, UA RARE (*)    All other components within normal limits  COMPREHENSIVE METABOLIC PANEL - Abnormal; Notable for the following components:   Potassium 3.1 (*)    Glucose, Bld 134 (*)    Total Protein 8.3 (*)    AST 48 (*)    All other components within normal limits  PREGNANCY, URINE    EKG None  Radiology DG Chest Port 1 View  Result Date: 10/01/2020 CLINICAL DATA:  Shortness of breath EXAM: PORTABLE CHEST 1 VIEW COMPARISON:  None. FINDINGS: The heart size and mediastinal contours are within normal limits. Both lungs are clear. The visualized skeletal structures are unremarkable. IMPRESSION: No active disease. Electronically Signed   By: Alcide Clever M.D.   On: 10/01/2020 21:09    Procedures Procedures   Medications Ordered in ED Medications  sodium chloride 0.9 % bolus 1,000 mL (has no administration in time range)  ondansetron (ZOFRAN) injection 4 mg (has no administration in time range)    ED Course  I have reviewed the triage vital signs and the nursing notes.  Pertinent labs & imaging results that were available during my care of the patient were reviewed by me and considered in my medical decision making (see chart for details).  Clinical Course as of 10/02/20 6546  Sat Oct 01, 2020  2108 Chest x-ray showing no acute infiltrates or pneumothorax. [MB]  2213 Patient has been able to give a urine sample, she is got about half of her fluid in. [MB]    Clinical Course User Index [MB] Terrilee Files, MD   MDM Rules/Calculators/A&P                         This patient complains of shortness of breath, nausea vomiting, body aches headache, flu; this involves an extensive number of treatment Options and is a complaint that carries with it a high risk of complications and Morbidity. The differential includes flu, COVID, metabolic derangement, dehydration, pneumonia  I ordered, reviewed and interpreted labs, which included chemistries with low potassium normal renal function, AST mildly elevated, pregnancy test negative, urinalysis with proteinuria elevation RBCs and WBCs, no overt infection I ordered medication IV fluids  I ordered imaging studies which included chest x-ray and I independently    visualized and interpreted imaging which showed no acute findings  Previous records obtained and reviewed in epic, no recent visits  After the interventions stated above, I reevaluated the patient and found patient to be hemodynamically improved.  She is comfortable plan for outpatient management of her symptoms with fluids, antiemetics, fever control.  Return instructions discussed   Final Clinical Impression(s) / ED Diagnoses Final diagnoses:  Flu  SOB (shortness of breath)  Non-intractable vomiting with nausea, unspecified vomiting type    Rx / DC Orders ED Discharge Orders         Ordered    ondansetron (ZOFRAN ODT) 4 MG disintegrating tablet  Every 8 hours PRN        10/01/20 2231           Terrilee Files, MD 10/02/20 [phone]

## 2021-06-22 IMAGING — DX DG CHEST 1V PORT
1 series · 1 of 1 positions shown · non-contrast
Comparison: None.

CLINICAL DATA: Shortness of breath

EXAM:
PORTABLE CHEST 1 VIEW

[chest ap]
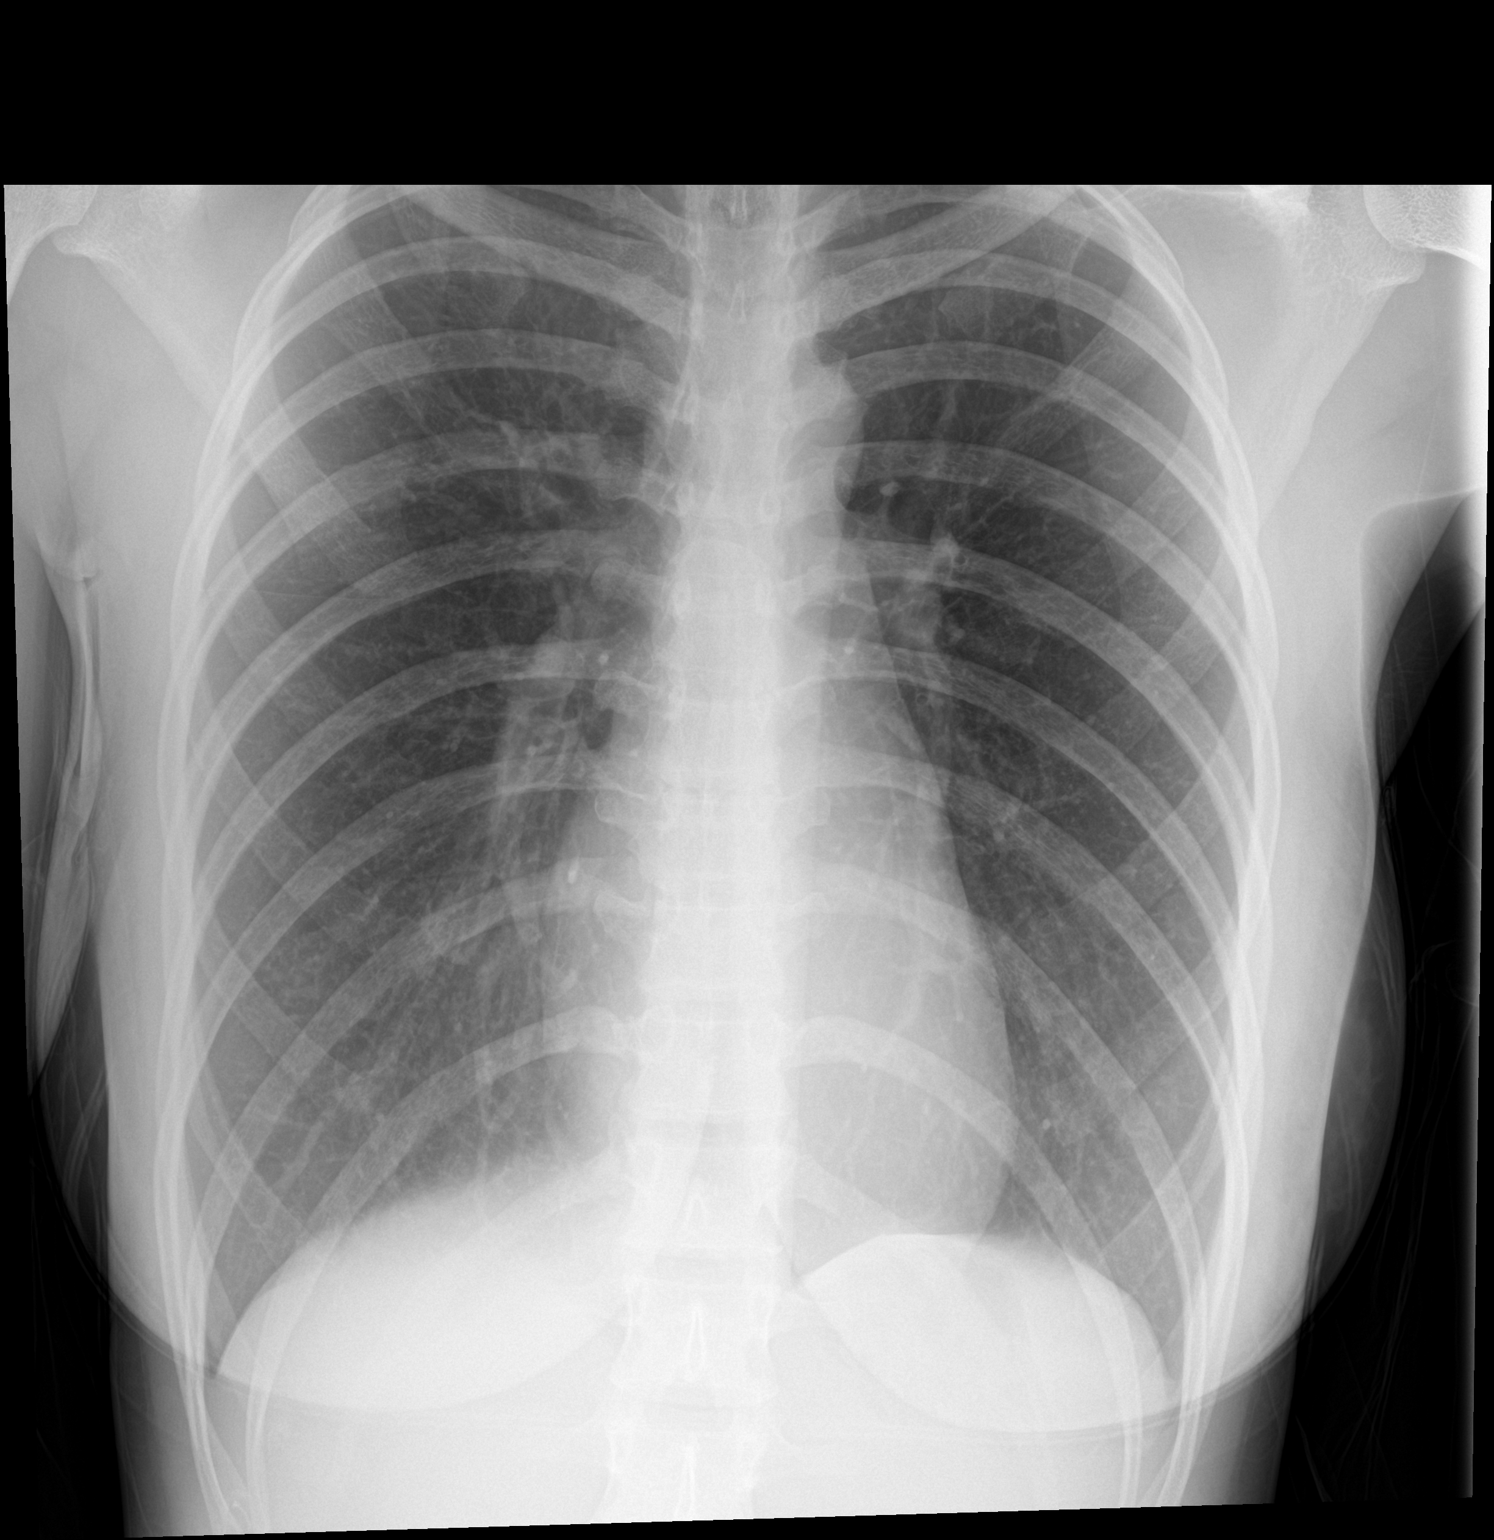

[1 of 1 positions shown; findings below may reference images not displayed]

FINDINGS: The heart size and mediastinal contours are within normal limits.
Both lungs are clear. The visualized skeletal structures are
unremarkable.
IMPRESSION: No active disease.
